# Patient Record
Sex: Male | Born: 1975 | Race: White | Hispanic: No | Marital: Married | Smoking: Never smoker
Health system: Southern US, Community
[De-identification: ages and names within clinical notes are randomized; demographics above are authoritative.]

## PROBLEM LIST (undated history)

## (undated) DIAGNOSIS — E7800 Pure hypercholesterolemia, unspecified: Secondary | ICD-10-CM

## (undated) HISTORY — PX: HERNIA REPAIR: SHX51

---

## 2004-08-31 ENCOUNTER — Ambulatory Visit: Payer: Self-pay | Admitting: Family Medicine

## 2004-09-25 ENCOUNTER — Ambulatory Visit: Payer: Self-pay | Admitting: Family Medicine

## 2005-03-20 ENCOUNTER — Ambulatory Visit: Payer: Self-pay | Admitting: Family Medicine

## 2016-04-10 ENCOUNTER — Emergency Department (HOSPITAL_COMMUNITY): Payer: Commercial Managed Care - PPO

## 2016-04-10 ENCOUNTER — Emergency Department (HOSPITAL_COMMUNITY)
Admission: EM | Admit: 2016-04-10 | Discharge: 2016-04-11 | Disposition: A | Discharge status: Home / Self Care | Payer: Commercial Managed Care - PPO | Attending: Emergency Medicine | Admitting: Emergency Medicine

## 2016-04-10 ENCOUNTER — Encounter (HOSPITAL_COMMUNITY): Payer: Self-pay

## 2016-04-10 DIAGNOSIS — Z79899 Other long term (current) drug therapy: Secondary | ICD-10-CM | POA: Diagnosis not present

## 2016-04-10 DIAGNOSIS — R51 Headache: Secondary | ICD-10-CM | POA: Insufficient documentation

## 2016-04-10 DIAGNOSIS — H4710 Unspecified papilledema: Secondary | ICD-10-CM | POA: Insufficient documentation

## 2016-04-10 DIAGNOSIS — R519 Headache, unspecified: Secondary | ICD-10-CM

## 2016-04-10 HISTORY — DX: Pure hypercholesterolemia, unspecified: E78.00

## 2016-04-10 LAB — BASIC METABOLIC PANEL
Anion gap: 9 (ref 5–15)
BUN: 10 mg/dL (ref 6–20)
CALCIUM: 9.2 mg/dL (ref 8.9–10.3)
CHLORIDE: 107 mmol/L (ref 101–111)
CO2: 22 mmol/L (ref 22–32)
CREATININE: 0.84 mg/dL (ref 0.61–1.24)
GFR calc Af Amer: >60 mL/min (ref >60)
GFR calc non Af Amer: >60 mL/min (ref >60)
GLUCOSE: 130 mg/dL — AB (ref 65–99)
Potassium: 3.7 mmol/L (ref 3.5–5.1)
Sodium: 138 mmol/L (ref 135–145)

## 2016-04-10 LAB — CBC
HCT: 44.2 % (ref 39.0–52.0)
Hemoglobin: 14.8 g/dL (ref 13.0–17.0)
MCH: 29.5 pg (ref 26.0–34.0)
MCHC: 33.5 g/dL (ref 30.0–36.0)
MCV: 88.2 fL (ref 78.0–100.0)
PLATELETS: 221 10*3/uL (ref 150–400)
RBC: 5.01 MIL/uL (ref 4.22–5.81)
RDW: 12.9 % (ref 11.5–15.5)
WBC: 6.9 10*3/uL (ref 4.0–10.5)

## 2016-04-10 LAB — PROTEIN, CSF: Total  Protein, CSF: 39 mg/dL (ref 15–45)

## 2016-04-10 LAB — GLUCOSE, CSF: GLUCOSE CSF: 63 mg/dL (ref 40–70)

## 2016-04-10 MED ORDER — GADOBENATE DIMEGLUMINE 529 MG/ML IV SOLN
17.0000 mL | Freq: Once | INTRAVENOUS | Status: AC | PRN
  Administered 2016-04-10: 17 mL via INTRAVENOUS

## 2016-04-10 NOTE — ED Notes (Signed)
The pt returned from xray.  Lying flat for awhile

## 2016-04-10 NOTE — Procedures (Signed)
Dry tap at L3/4.  Successful LP at L2/3.  Opening pressure 9 cm H20.  9cc collected.  JWatts MD

## 2016-04-10 NOTE — ED Notes (Signed)
LP tray and needles placed at bedside

## 2016-04-10 NOTE — ED Provider Notes (Signed)
CSN: [id number]     Arrival date & time 04/10/16  1321 History   First MD Initiated Contact with Patient 04/10/16 1531     Chief Complaint  Patient presents with  . sent by MD for MRI      (Consider location/radiation/quality/duration/timing/severity/associated sxs/prior Treatment) Patient is a 40 y.o. male presenting with headaches. The history is provided by the patient.  Headache Location: left eye radiating to left temple. Quality:  Dull Severity currently:  0/10 Severity at highest:  4/10 Onset quality:  Gradual Duration:  3 weeks Timing:  Constant Progression:  Unchanged Chronicity:  New Associated symptoms: photophobia   Associated symptoms: no abdominal pain, no back pain, no congestion, no cough, no diarrhea, no dizziness, no eye pain, no fever, no nausea, no neck pain, no numbness, no sore throat, no vomiting and no weakness     Past Medical History  Diagnosis Date  . High cholesterol    Past Surgical History  Procedure Laterality Date  . Hernia repair     No family history on file. Social History  Substance Use Topics  . Smoking status: Never Smoker   . Smokeless tobacco: None  . Alcohol Use: None    Review of Systems  Constitutional: Negative for fever and chills.  HENT: Negative for congestion and sore throat.   Eyes: Positive for photophobia. Negative for pain.  Respiratory: Negative for cough and shortness of breath.   Cardiovascular: Negative for chest pain and palpitations.  Gastrointestinal: Negative for nausea, vomiting, abdominal pain and diarrhea.  Endocrine: Negative.   Genitourinary: Negative for flank pain.  Musculoskeletal: Negative for back pain and neck pain.  Skin: Negative for rash.  Allergic/Immunologic: Negative.   Neurological: Positive for headaches. Negative for dizziness, syncope, weakness, light-headedness and numbness.  Psychiatric/Behavioral: Negative for confusion.    Allergies  Septra  Home Medications   Prior to  Admission medications   Medication Sig Start Date End Date Taking? Authorizing Provider  rosuvastatin (CRESTOR) 5 MG tablet Take 5 mg by mouth at bedtime.   Yes Historical Provider, MD   BP 109/69 mmHg  Pulse 54  Temp(Src) 97.9 F (36.6 C) (Oral)  Resp 20  SpO2 97% Physical Exam  Constitutional: He is oriented to person, place, and time. He appears well-developed and well-nourished.  HENT:  Head: Normocephalic and atraumatic.  Eyes: Conjunctivae and EOM are normal. Pupils are equal, round, and reactive to light.  Neck: Normal range of motion. Neck supple.  Cardiovascular: Normal rate, regular rhythm, normal heart sounds and intact distal pulses.   Pulmonary/Chest: Effort normal and breath sounds normal. No respiratory distress.  Abdominal: Soft. Bowel sounds are normal. There is no tenderness.  Musculoskeletal: Normal range of motion.  Neurological: He is alert and oriented to person, place, and time. He has normal strength and normal reflexes. No cranial nerve deficit or sensory deficit. He displays a negative Romberg sign. GCS eye subscore is 4. GCS verbal subscore is 5. GCS motor subscore is 6.  Normal finger to nose bilaterally.   No pronator drift bilaterally.    Skin: Skin is warm and dry.    ED Course  Procedures (including critical care time) Labs Review Labs Reviewed  BASIC METABOLIC PANEL - Abnormal; Notable for the following:    Glucose, Bld 130 (*)    All other components within normal limits  CSF CELL COUNT WITH DIFFERENTIAL - Abnormal; Notable for the following:    Color, CSF STRAW (*)    Appearance, CSF HAZY (*)  RBC Count, CSF 420 (*)    All other components within normal limits  CSF CELL COUNT WITH DIFFERENTIAL - Abnormal; Notable for the following:    RBC Count, CSF 73 (*)    All other components within normal limits  CSF CULTURE  CBC  GLUCOSE, CSF  PROTEIN, CSF    Imaging Review Mr Laqueta Jean Wo Contrast  04/10/2016  CLINICAL DATA:  40 y/o M;  headaches for several weeks and papilledema per ophthalmologist. Concern for intracranial mass. EXAM: MRI HEAD AND ORBITS WITHOUT AND WITH CONTRAST TECHNIQUE: Multiplanar, multiecho pulse sequences of the brain and surrounding structures were obtained without and with intravenous contrast. Multiplanar, multiecho pulse sequences of the orbits and surrounding structures were obtained including fat saturation techniques, before and after intravenous contrast administration. CONTRAST:  17mL MULTIHANCE GADOBENATE DIMEGLUMINE 529 MG/ML IV SOLN COMPARISON:  None. FINDINGS: MRI HEAD FINDINGS Brain: No diffusion restriction to suggest acute infarct. There are several scattered punctate foci of susceptibility hypointensity within the right cerebellar hemisphere, right posterior thalamus, and scattered throughout the deep white matter of the supratentorial brain probably representing hemosiderin deposition from prior micro hemorrhage. Dense mineralization of the globus palatine, likely dystrophic calcifications. No significant T2 FLAIR signal abnormality. No focal mass effect. Extra-axial space: Normal ventricular size. No midline shift. No effacement of basilar cisterns. No extra-axial collection is identified. Proximal intracranial flow voids are maintained. Partially empty sella turcica. Right paramedian anterior frontal dural mass measuring 14 x 11 x 15 mm (AP x ML x CC) series 19, image 11 and series 17, image 35 with local adjacent smooth dural thickening. There is minimal local mass effect on the adjacent frontal lobe. There is no edema around the lesion. Other: Small bilateral maxillary mucous retention cysts and partial opacification of ethmoid air cells. Underpneumatized frontal sinuses. Partial opacification of the right mastoid tip. Calvarium is unremarkable. MRI ORBITS FINDINGS No mass or abnormal signal of the optic nerves, chiasm, or optic tracts. No effacement of intraconal or extraconal fat. Left intra-ocular  lens replacement. No orbital mass is identified. The lacrimal glands are unremarkable. No abnormal mass or enhancement of the cavernous sinuses or Meckel's cave. Extraocular muscles are normal. Slight elevation of the left optic disc is may represent papilledema series 10, image 16. Additionally, there is circumferential optic nerve sheath enhancement of the retrobulbar are left optic nerve, series 15, image 19. There is no associated enhancement of the optic nerve and there is no abnormal T2 signal on the coronal T2 fat saturated sequence. IMPRESSION: 1. Slight elevation of the left optic disk may represent papilledema. 2. Asymmetric nerve sheath enhancement of the retrobulbar left optic nerve. This can be seen in the setting of inflammatory and ischemic processes of the globe, pseudotumor cerebri, granulomatous disease, or in the setting of optic neuritis. However, the optic nerves bilaterally are normal in size and signal. Meningioma and lymphoma can also present as optic sheath enhancement, but there is no mass like component, and are considered less likely. 3. 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 No significant mass effect or edema of the brain. This unlikely to explain papilledema. 4. Several scattered chronic microhemorrhage in the brain probably related to hypertension, less likely amyloid. 5. Partially empty sella turcica is a nonspecific finding but can be seen in the setting of pseudotumor cerebri. Electronically Signed   By: Mitzi Hansen M.D.   On: 04/10/2016 18:09   Mr Birdie Hopes Wo/w Cm  04/10/2016  CLINICAL DATA:  40 y/o M; headaches for several weeks and papilledema per ophthalmologist. Concern for intracranial mass. EXAM: MRI HEAD AND ORBITS WITHOUT AND WITH CONTRAST TECHNIQUE: Multiplanar, multiecho pulse sequences of the brain and surrounding structures were obtained without and with intravenous contrast. Multiplanar, multiecho pulse sequences of  the orbits and surrounding structures were obtained including fat saturation techniques, before and after intravenous contrast administration. CONTRAST:  17mL MULTIHANCE GADOBENATE DIMEGLUMINE 529 MG/ML IV SOLN COMPARISON:  None. FINDINGS: MRI HEAD FINDINGS Brain: No diffusion restriction to suggest acute infarct. There are several scattered punctate foci of susceptibility hypointensity within the right cerebellar hemisphere, right posterior thalamus, and scattered throughout the deep white matter of the supratentorial brain probably representing hemosiderin deposition from prior micro hemorrhage. Dense mineralization of the globus palatine, likely dystrophic calcifications. No significant T2 FLAIR signal abnormality. No focal mass effect. Extra-axial space: Normal ventricular size. No midline shift. No effacement of basilar cisterns. No extra-axial collection is identified. Proximal intracranial flow voids are maintained. Partially empty sella turcica. Right paramedian anterior frontal dural mass measuring 14 x 11 x 15 mm (AP x ML x CC) series 19, image 11 and series 17, image 35 with local adjacent smooth dural thickening. There is minimal local mass effect on the adjacent frontal lobe. There is no edema around the lesion. Other: Small bilateral maxillary mucous retention cysts and partial opacification of ethmoid air cells. Underpneumatized frontal sinuses. Partial opacification of the right mastoid tip. Calvarium is unremarkable. MRI ORBITS FINDINGS No mass or abnormal signal of the optic nerves, chiasm, or optic tracts. No effacement of intraconal or extraconal fat. Left intra-ocular lens replacement. No orbital mass is identified. The lacrimal glands are unremarkable. No abnormal mass or enhancement of the cavernous sinuses or Meckel's cave. Extraocular muscles are normal. Slight elevation of the left optic disc is may represent papilledema series 10, image 16. Additionally, there is circumferential optic  nerve sheath enhancement of the retrobulbar are left optic nerve, series 15, image 19. There is no associated enhancement of the optic nerve and there is no abnormal T2 signal on the coronal T2 fat saturated sequence. IMPRESSION: 1. Slight elevation of the left optic disk may represent papilledema. 2. Asymmetric nerve sheath enhancement of the retrobulbar left optic nerve. This can be seen in the setting of inflammatory and ischemic processes of the globe, pseudotumor cerebri, granulomatous disease, or in the setting of optic neuritis. However, the optic nerves bilaterally are normal in size and signal. Meningioma and lymphoma can also present as optic sheath enhancement, but there is no mass like component, and are considered less likely. 3. 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 No significant mass effect or edema of the brain. This unlikely to explain papilledema. 4. Several scattered chronic microhemorrhage in the brain probably related to hypertension, less likely amyloid. 5. Partially empty sella turcica is a nonspecific finding but can be seen in the setting of pseudotumor cerebri. Electronically Signed   By: Mitzi HansenLance  Furusawa-Stratton M.D.   On: 04/10/2016 18:09   Dg Lumbar Puncture Fluoro Guide  04/10/2016  CLINICAL DATA:  Left eye swelling.  Evaluate for pseudotumor. EXAM: DIAGNOSTIC LUMBAR PUNCTURE UNDER FLUOROSCOPIC GUIDANCE FLUOROSCOPY TIME:  Radiation Exposure Index (as provided by the fluoroscopic device): 4.3mG y air kerma PROCEDURE: Informed consent was obtained from the patient prior to the procedure, including potential complications of headache, allergy, and pain. With the patient prone, the lower back was prepped with Betadine. 1% Lidocaine was used for local anesthesia. Lumbar  puncture was performed at the L3-4 level and was a dry tap. The L2-3 level, which overlaps the L3 vertebra, was then selected and anesthetized. Lumbar puncture was performed in 1 pass  using a 22 gauge needle with return of red tinged, clearing CSF. The patient was rolled left lateral decubitus and opening pressure was measured at 9 cm water. Patient reports dehydration. 9 ml of CSF were obtained for laboratory studies. The patient tolerated the procedure well and there were no apparent complications. IMPRESSION: 1. Successful lumbar puncture. 2. Opening pressure 9 cm of water. Electronically Signed   By: Marnee Spring M.D.   On: 04/10/2016 23:24   I have personally reviewed and evaluated these images and lab results as part of my medical decision-making.   EKG Interpretation None      MDM   Final diagnoses:  Frequent headaches  Papilledema    Patient is a 40 year old male with a history of cataract surgery of the left eye 4 weeks ago presenting for left eye pain and intermittent headaches. Reports unilateral left eye scratching pain associated with photophobia and subsequent mild left-sided headaches which only lasts for hours at a time. Do not occur at night or upon awakening in the morning. Denies any neurologic symptoms otherwise.  The patient saw his ophthalmologist today who recommended come to the emergency department due to bilateral papilledema and concern for mass versus pseudotumor. On evaluation the patient displays no distress with no current headache or eye pain. Neurologic exam with no focal deficits. Discussed with the [facility] on call member who was familiar with the patient and recommended MRI with and without contrast of the orbits and brain with possible neurology consultation +/- LP.  Low suspicion for meningitis.  Documentation brought with patient reports recent diagnosis of left uveitis and rx for meds given.  Low suspicion for temporal arteritis or cluster headaches.   MRI brain and orbits performed showing multiple findings concerning for possible pseudotumor cerebri. LP attempted by myself as above but unable to obtain sample due to  poor anatomical markers and dry tap. Subsequently IR consulted who performed tap with opening pressure of 9 mmHg. Discussed post LP headaches and ways to alleviate symptoms. CSF studies unremarkable. Discussed with the patient in depth the results and he understands to continue follow-up with ophthalmology. Given information for neurology follow-up if the patient wishes. Advised to follow-up with his PCP in 3 days.  Labs and images were viewed by myself  incorporated into medical decision making.  Discussed pertinent finding with patient or caregiver prior to discharge with no further questions.  Immediate return precautions given and understood.  Medical decision making supervised by my attending Dr. Ethelda Chick.   Tery Sanfilippo, MD PGY-3 Emergency Medicine   Tery Sanfilippo, MD 04/11/16 1610  Doug Sou, MD 04/11/16 [phone]

## 2016-04-10 NOTE — ED Notes (Signed)
viausal acuity  20 30 rt eye 20/30 lt eye

## 2016-04-10 NOTE — ED Provider Notes (Signed)
Complains and headaches for the past several weeks which occur typically at 1:30 in the afternoon and lasts into the evening. He was seen by his ophthalmologist today Almony, felt to have papilledema sent here for brain imaging. Patient denies visual changes he is presently asymptomatic. On exam alert no distress HEENT exam no facial asymmetry eyes pupils 3 mm reactive to light. Fundi not well-visualized neck supple neurologic Glasgow Coma Score 15 cranial nerves II through XII intact was all extremities well. Appears in no distress  Doug SouSam Nysir Fergusson, MD 04/10/16 1555

## 2016-04-10 NOTE — ED Notes (Signed)
To mri 

## 2016-04-10 NOTE — ED Notes (Signed)
[facility] needs to have neurology consult and MRI of brain and orbits today after having headaches x 3-4 weeks.  Patient has bilateral disc edema.  Patient to bring all paperwork from MD.

## 2016-04-10 NOTE — ED Notes (Signed)
The pt was sent here by his eye doctor she saw an enlarged blood vessel in his eye   He has had cataract surgery in  The past  And she thought he needed a mri

## 2016-04-10 NOTE — ED Notes (Addendum)
Patient seen by ophthalmologist for abnormal visual exam today post cataract surgery in April. Was told he had swelling to optic nerve. Denies visual changes. NAD. Was told to come to ED for MRI. Patient unsure if to see neurology or just wait until follow-up appointment next week for results

## 2016-04-10 NOTE — ED Notes (Signed)
Pt stiill in mri

## 2016-04-10 NOTE — ED Notes (Signed)
Going to xray for flouro lp

## 2016-04-10 NOTE — ED Notes (Signed)
The pt just returned from mri 

## 2016-04-11 LAB — CSF CELL COUNT WITH DIFFERENTIAL
RBC COUNT CSF: 73 /mm3 — AB
RBC Count, CSF: 420 /mm3 — ABNORMAL HIGH
TUBE #: 1
TUBE #: 4
WBC CSF: 1 /mm3 (ref 0–5)
WBC, CSF: 1 /mm3 (ref 0–5)

## 2016-04-11 MED ORDER — ACETAMINOPHEN 325 MG PO TABS
650.0000 mg | ORAL_TABLET | Freq: Once | ORAL | Status: AC
  Administered 2016-04-11: 650 mg via ORAL
  Filled 2016-04-11: qty 2

## 2016-04-11 NOTE — ED Notes (Signed)
Discharge instructions reviewed - voiced understanding  Encouraged to drink plenty of fluids

## 2016-04-14 LAB — CSF CULTURE

## 2016-04-14 LAB — CSF CULTURE W GRAM STAIN: Culture: NO GROWTH

## 2017-10-24 IMAGING — MR MR ORBITS WO/W CM
13 of 20 series · 30 of 48 positions shown · IV contrast (multihance)
Comparison: None.

CLINICAL DATA: 40 y/o M; headaches for several weeks and
papilledema per ophthalmologist. Concern for intracranial mass.

EXAM:
MRI HEAD AND ORBITS WITHOUT AND WITH CONTRAST
TECHNIQUE: Multiplanar, multiecho pulse sequences of the brain and surrounding
structures were obtained without and with intravenous contrast.
Multiplanar, multiecho pulse sequences of the orbits and surrounding
structures were obtained including fat saturation techniques, before
and after intravenous contrast administration.
CONTRAST:  17mL MULTIHANCE GADOBENATE DIMEGLUMINE 529 MG/ML IV SOLN

[Series 3: DWI · axial · 3.0mm · 0.94mm/px · z∈[-59,+87]mm · 7 of 99 slices shown (1 of 2)]
[im 1/99]
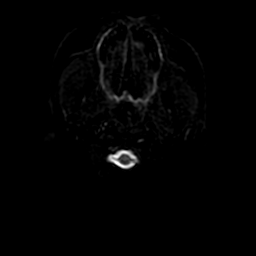
[im 17/99]
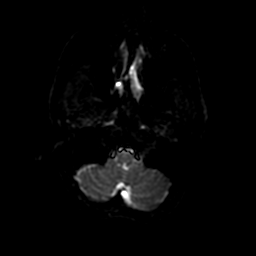
[im 33/99]
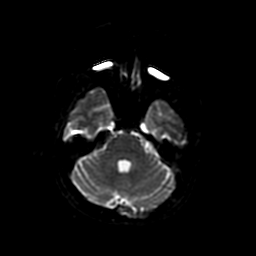
[im 50/99]
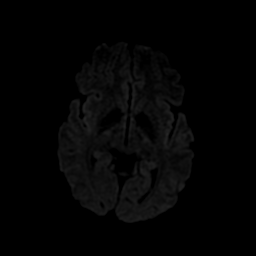
[im 66/99]
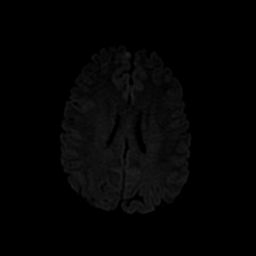
[im 82/99]
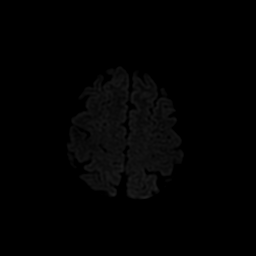
[im 99/99]
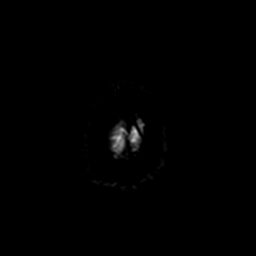

[Series 4: FLAIR · sagittal · 5.0mm · 0.47mm/px · 1 of 23 slices shown (1 of 2)]
[im 1/23]
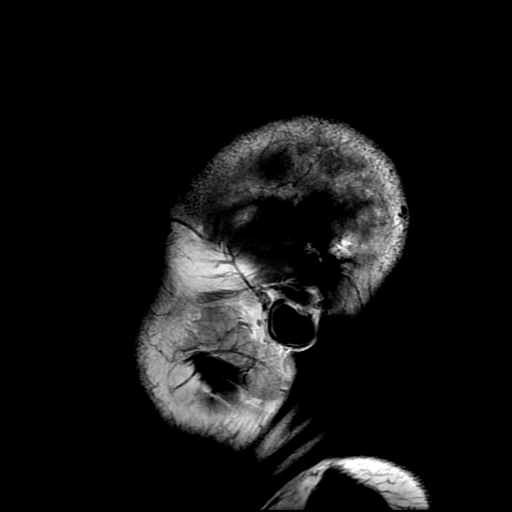

[Series 5: DWI · coronal · 4.0mm · 0.94mm/px · 4 of 72 slices shown (2 of 2)]
[im 1/72]
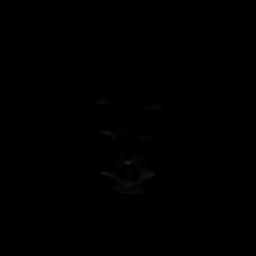
[im 24/72]
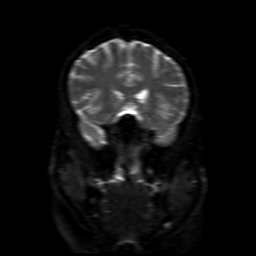
[im 48/72]
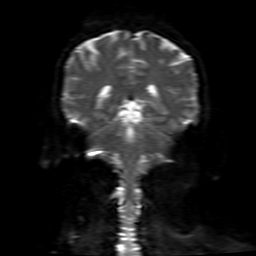
[im 72/72]
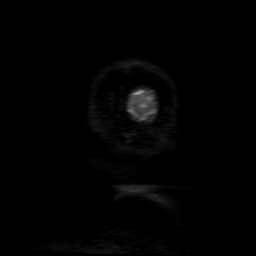

[Series 6: T2 · axial · 5.0mm · 0.47mm/px · 1 of 25 slices shown (1 of 2)]
[im 1/25]
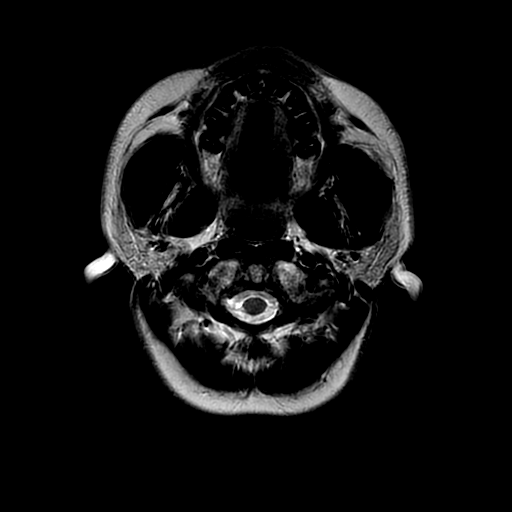

[Series 7: FLAIR · axial · 5.0mm · 0.47mm/px · 1 of 25 slices shown (2 of 2)]
[im 1/25]
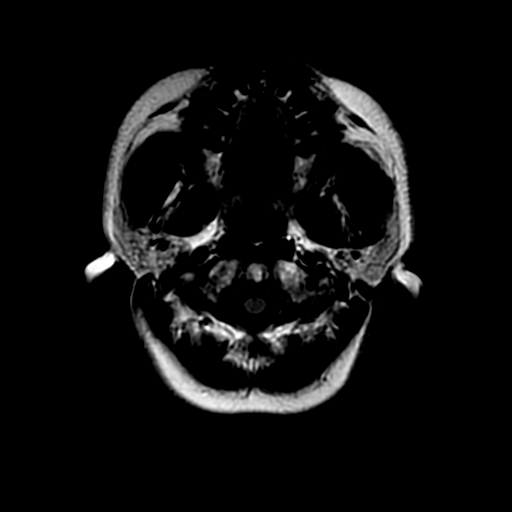

[Series 8: T2 fat-sat · coronal · 4.0mm · 0.35mm/px · 2 of 29 slices shown (1 of 2)]
[im 1/29]
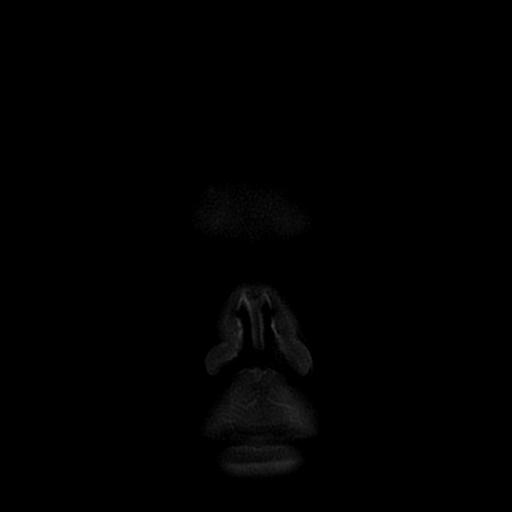
[im 29/29]
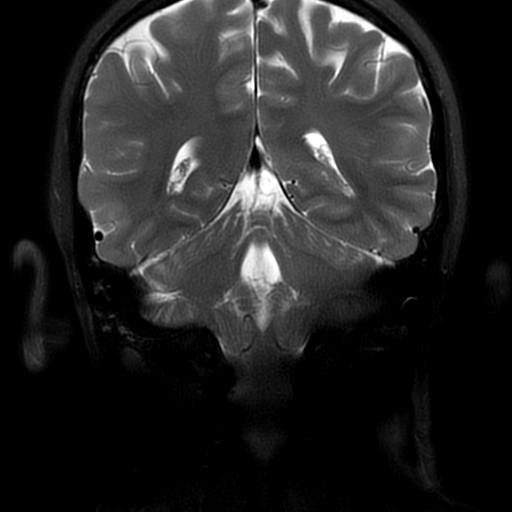

[Series 9: T1 · coronal · 4.0mm · 0.35mm/px · 2 of 29 slices shown]
[im 1/29]
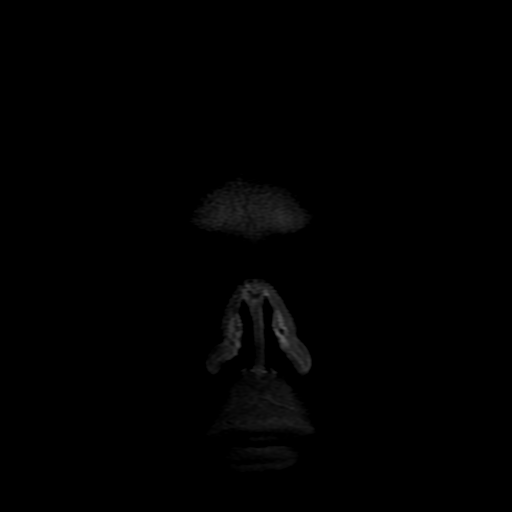
[im 29/29]
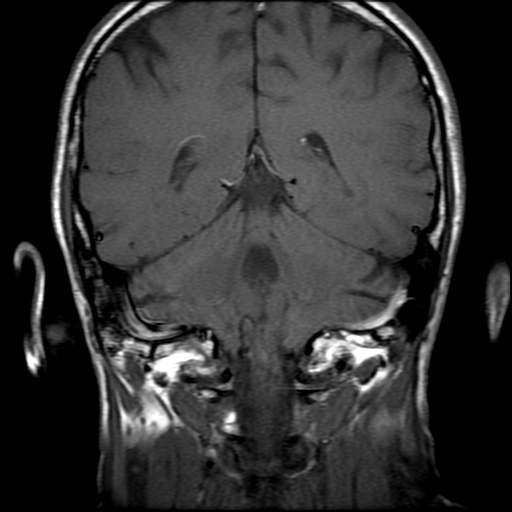

[Series 10: T2 fat-sat · axial · 3.0mm · 0.35mm/px · 1 of 24 slices shown (2 of 2)]
[im 1/24]
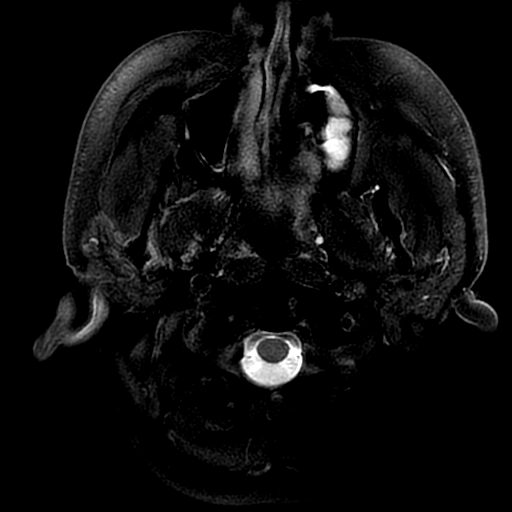

[Series 14: T2 · coronal · 5.0mm · 0.39mm/px · 2 of 30 slices shown (2 of 2)]
[im 1/30]
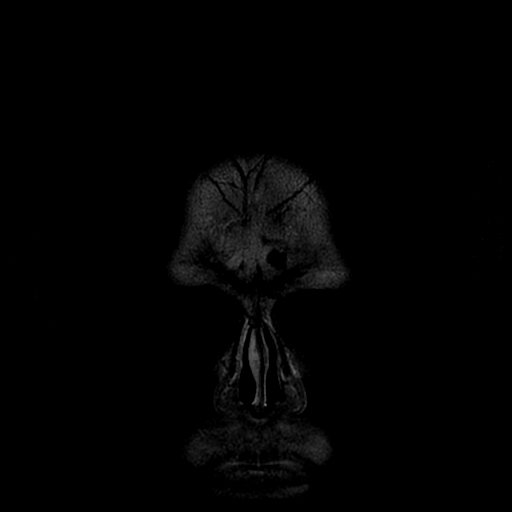
[im 30/30]
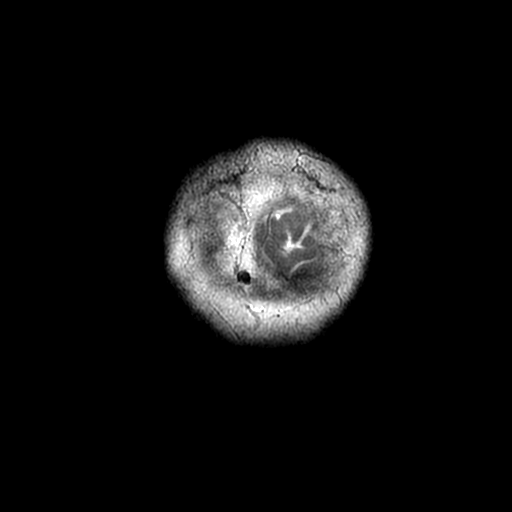

[Series 19: FLAIR post-contrast · sagittal · 5.0mm · 0.47mm/px · 1 of 23 slices shown]
[im 1/23]
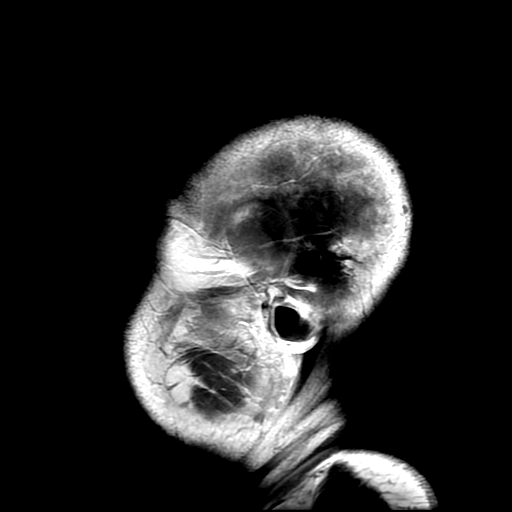

[Series 350: ADC · axial · 3.0mm · 0.94mm/px · z∈[-59,+87]mm · 3 of 50 slices shown (1 of 3)]
[im 1/50]
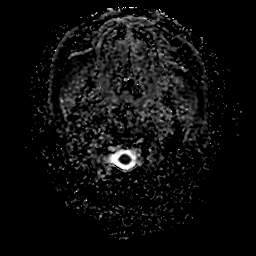
[im 25/50]
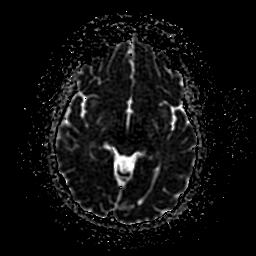
[im 50/50]
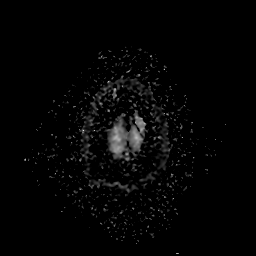

[Series 351: ADC · axial · 3.0mm · 0.94mm/px · z∈[-59,+87]mm · 3 of 50 slices shown (2 of 3)]
[im 1/50]
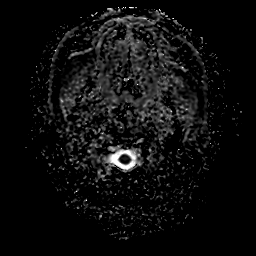
[im 25/50]
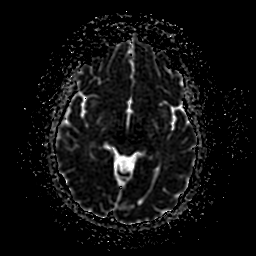
[im 50/50]
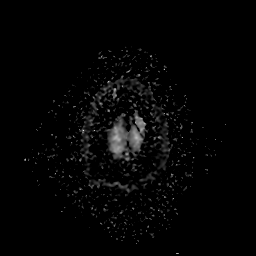

[Series 550: ADC · coronal · 4.0mm · 0.94mm/px · 2 of 36 slices shown (3 of 3)]
[im 1/36]
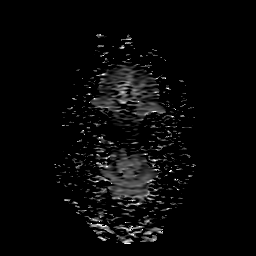
[im 36/36]
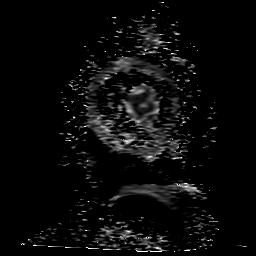

[30 of 48 positions shown; findings below may reference images not displayed]

FINDINGS: MRI HEAD FINDINGS

Brain: No diffusion restriction to suggest acute infarct. There are
several scattered punctate foci of susceptibility hypointensity
within the right cerebellar hemisphere, right posterior thalamus,
and scattered throughout the deep white matter of the supratentorial
brain probably representing hemosiderin deposition from prior micro
hemorrhage. Dense mineralization of the globus palatine, likely
dystrophic calcifications. No significant T2 FLAIR signal
abnormality. No focal mass effect.

Extra-axial space: Normal ventricular size. No midline shift. No
effacement of basilar cisterns. No extra-axial collection is
identified. Proximal intracranial flow voids are maintained.
Partially empty sella turcica. Right paramedian anterior frontal
dural mass measuring 14 x 11 x 15 mm (AP x ML x CC) series 19, image
11 and series 17, image 35 with local adjacent smooth dural
thickening. There is minimal local mass effect on the adjacent
frontal lobe. There is no edema around the lesion.

Other: Small bilateral maxillary mucous retention cysts and partial
opacification of ethmoid air cells. Underpneumatized frontal
sinuses. Partial opacification of the right mastoid tip. Calvarium
is unremarkable.

MRI ORBITS FINDINGS

No mass or abnormal signal of the optic nerves, chiasm, or optic
tracts. No effacement of intraconal or extraconal fat. Left
intra-ocular lens replacement. No orbital mass is identified. The
lacrimal glands are unremarkable. No abnormal mass or enhancement of
the cavernous sinuses or Meckel's cave. Extraocular muscles are
normal.

Slight elevation of the left optic disc is may represent papilledema
series 10, image 16. Additionally, there is circumferential optic
nerve sheath enhancement of the retrobulbar are left optic nerve,
series 15, image 19. There is no associated enhancement of the optic
nerve and there is no abnormal T2 signal on the coronal T2 fat
saturated sequence.
IMPRESSION: 1. Slight elevation of the left optic disk may represent
papilledema.
2. Asymmetric nerve sheath enhancement of the retrobulbar left optic
nerve. This can be seen in the setting of inflammatory and ischemic
processes of the globe, pseudotumor cerebri, granulomatous disease,
or in the setting of optic neuritis. However, the optic nerves
bilaterally are normal in size and signal. Meningioma and lymphoma
can also present as optic sheath enhancement, but there is no mass
like component, and are considered less likely.
3. Right paramedian anterior frontal 14 mm dural mass is likely a
meningioma. No significant mass effect or edema of the brain. This
unlikely to explain papilledema.
4. Several scattered chronic microhemorrhage in the brain probably
related to hypertension, less likely amyloid.
5. Partially empty sella turcica is a nonspecific finding but can be
seen in the setting of pseudotumor cerebri.

By: Bouchra Albares M.D.

## 2024-10-13 ENCOUNTER — Other Ambulatory Visit: Payer: Self-pay | Admitting: Internal Medicine
